# Patient Record
Sex: Female | Born: 1995 | Race: White | Hispanic: No | Marital: Single | Smoking: Never smoker
Health system: Southern US, Community
[De-identification: ages and names within clinical notes are randomized; demographics above are authoritative.]

## PROBLEM LIST (undated history)

## (undated) DIAGNOSIS — F329 Major depressive disorder, single episode, unspecified: Secondary | ICD-10-CM

## (undated) DIAGNOSIS — F32A Depression, unspecified: Secondary | ICD-10-CM

## (undated) DIAGNOSIS — F419 Anxiety disorder, unspecified: Secondary | ICD-10-CM

## (undated) DIAGNOSIS — L709 Acne, unspecified: Secondary | ICD-10-CM

## (undated) HISTORY — PX: WISDOM TOOTH EXTRACTION: SHX21

---
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
## 2014-05-14 ENCOUNTER — Encounter: Payer: Self-pay | Admitting: Sports Medicine

## 2014-05-14 ENCOUNTER — Other Ambulatory Visit: Payer: Self-pay | Admitting: Sports Medicine

## 2014-05-14 ENCOUNTER — Ambulatory Visit (INDEPENDENT_AMBULATORY_CARE_PROVIDER_SITE_OTHER): Payer: BLUE CROSS/BLUE SHIELD | Admitting: Sports Medicine

## 2014-05-14 ENCOUNTER — Ambulatory Visit
Admission: RE | Admit: 2014-05-14 | Discharge: 2014-05-14 | Disposition: A | Discharge status: Home / Self Care | Payer: BLUE CROSS/BLUE SHIELD | Source: Ambulatory Visit | Attending: Sports Medicine | Admitting: Sports Medicine

## 2014-05-14 VITALS — BP 119/85 | Ht 69.0 in | Wt 170.0 lb

## 2014-05-14 DIAGNOSIS — M25551 Pain in right hip: Secondary | ICD-10-CM | POA: Diagnosis not present

## 2014-05-14 DIAGNOSIS — G8929 Other chronic pain: Secondary | ICD-10-CM

## 2014-05-14 MED ORDER — DICLOFENAC 35 MG PO CAPS: ORAL_CAPSULE | ORAL | Status: DC

## 2014-05-15 DIAGNOSIS — G8929 Other chronic pain: Secondary | ICD-10-CM | POA: Insufficient documentation

## 2014-05-15 DIAGNOSIS — M25551 Pain in right hip: Secondary | ICD-10-CM

## 2014-05-15 DIAGNOSIS — F341 Dysthymic disorder: Secondary | ICD-10-CM | POA: Insufficient documentation

## 2014-05-15 MED ORDER — DICLOFENAC SODIUM 75 MG PO TBEC: 75.0000 mg | DELAYED_RELEASE_TABLET | Freq: Two times a day (BID) | ORAL | Status: DC

## 2014-05-15 NOTE — Progress Notes (Signed)
Alicia SomKailey M Gillespie - 19 y.o. female MRN [id number]  Date of birth: Nov 22, 1995  SUBJECTIVE:  Including CC & ROS.  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 Patient and parents at bedside reports that she's had on and off hip pain for approximately 3-4 years now with no provoking injury or trauma. She localizes her pain to her right groin describes the pain as a grinding sensation. Denies any sharp pains on mostly a dull ache. She reports that more recently over the past several weeks the pain has occurred and hasn't resolved as it usually does over a short period of time. Noted no recent injuries or trauma but has been playing softball regularly. She attempted to treat her pain with intermittent anti-inflammatories with no significant relief. She denies any clicking or locking but does report some decreased range of motion. Denies any numbness tingling or burning. Denies any weakness.  Other associated history patient has a history of frequent prednisone use as a child for respiratory issues proximally once a year. Her dad has a history of AVN diagnosed at the age 20 with a recent hip replacement in his 1850s. Patient runs fairly regularly approximate 10-20 miles a week outside of softball. She denies any pelvic pain, urinary symptoms, her vaginal symptoms.   ROS: Review of systems otherwise negative except for information present in HPI  HISTORY: Past Medical, Surgical, Social, and Family History Reviewed & Updated per EMR. Pertinent Historical Findings include: Relatively healthy with normal growth and development History of depression currently on Zoloft.  DATA REVIEWED: Patient has had no prior  x-rays or MRI of her hip in the past. AP pelvis X-rays were completed after patient's office visit today revealed no degenerative changes. No signs of AVN, no sclerosis. 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 Normal femoral head and neck.  PHYSICAL EXAM:  VS: BP:119/85 mmHg  HR: bpm  TEMP: ( )  RESP:   HT:5\' 9"  (175.3 cm)   WT:170 lb (77.111 kg)  BMI:25.2 Right HIP EXAM:  General: well nourished Skin of LE: warm; dry, no rashes, lesions, ecchymosis or erythema. Vascular: Dorsal pedal and femoral pulses 2+ bilaterally Neurologically: Sensation to light touch lower extremities equal and intact bilaterally.    Observation - no ecchymosis, edema, or hematoma present over the anterior, lateral, or posterior soft tissue surrounding the hip Palpation:  No anterior hip joint tenderness No tenderness over the pubic synthesis,  No tenderness of the the ASIS, no pain over the iliac crest,  No tenderness of the lateral greater trochanter or bursa, No PSIS tenderness or SI joint pain ROM: Normal Hip motion in flexion, extension, Slight decrease in external rotation of the right hip compared to the left. Slight decrease in internal rotation. Muscle strength: Mild pain/mild weakness in iliopsoas flexion, rectus femoral flexion, normal hamstring and gluteal extension, hip adduction or abduction.  No pain or weakness with gluteus medius and minimus hip abduction, abdominal muscle contraction forward or oblique positions. Normal gait   MSK US: Ultrasound of the femoral head  and neck reveal no signs of bony abnormalities stress fracture. Musculature around anterior hip also appears normal with no signs of tears.  ASSESSMENT & PLAN: See problem based charting & AVS for pt instructions. Impression: Currently on the differential include pincer deformity causing intermittent hip dysfunction and pain. Also possible is labral pathology. Also possible is muscle tendinitis of the hip  flexors.  Recommendations: -Given the patient's pain in the past has been short-lived and resolved with no intervention and that she has been playing softball more regularly this year who recommended relative rest  initially after softball season to see if this calms down her pain. -We'll start her on diclofenac 75 mg twice a day to help control inflammation and pain. -No signs of acute abnormalities or AVN on her hip x-rays although she does have a pincer deformity that could be contributing to her pain. -Will follow-up with patient in 4-5 weeks if symptoms are not improving after relative rest and anti-inflammatories will likely proceed with MRI possibly MRI arthrogram to evaluate acetabulum labrum localized musculature.

## 2014-05-27 ENCOUNTER — Other Ambulatory Visit: Payer: Self-pay | Admitting: *Deleted

## 2014-05-27 MED ORDER — MELOXICAM 15 MG PO TABS: 15.0000 mg | ORAL_TABLET | Freq: Every day | ORAL | Status: DC

## 2014-05-28 ENCOUNTER — Ambulatory Visit: Payer: BLUE CROSS/BLUE SHIELD | Admitting: Sports Medicine

## 2014-06-11 ENCOUNTER — Ambulatory Visit (INDEPENDENT_AMBULATORY_CARE_PROVIDER_SITE_OTHER): Payer: BLUE CROSS/BLUE SHIELD | Admitting: Sports Medicine

## 2014-06-11 ENCOUNTER — Encounter: Payer: Self-pay | Admitting: Sports Medicine

## 2014-06-11 VITALS — BP 116/73 | HR 75 | Ht 69.0 in | Wt 170.0 lb

## 2014-06-11 DIAGNOSIS — M25551 Pain in right hip: Secondary | ICD-10-CM

## 2014-06-11 DIAGNOSIS — G8929 Other chronic pain: Secondary | ICD-10-CM | POA: Diagnosis not present

## 2014-06-11 NOTE — Progress Notes (Signed)
Alicia Gillespie - 19 y.o. female MRN [id number]  Date of birth: [birth year]  SUBJECTIVE:  Including CC & ROS.  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 Patient and mom and bedside report that since she was last seen 1 month ago after completing her softball season and taking anti-inflammatory medication with diclofenac and then meloxicam patient has had no clinical improvement in her right groin and hip pain. She did not tolerate anti-inflammatory well because of GI upset.  Patient has had this pain off and on for 3-4 years is no provoking injury or trauma. She continues to describe a dull achy pain deep in her right groin with occasional sharp pain with activity as minimal as walking. She also said occasionally describes it as a grinding sensation. The pain has not changed at all since he stopped playing softball. X-rays done one month ago revealed mild pincer deformity at the superior aspect of the acetabulum but otherwise no signs of fracture or AVN. Patient denies any associated weakness, numbness, tingling, radiation or swelling but does report a occasional antalgic gait.  She denies any pelvic pain, urinary symptoms, her vaginal symptoms.   ROS: Review of systems otherwise negative except for information present in HPI  HISTORY: Past Medical, Surgical, Social, and Family History Reviewed & Updated per EMR. Pertinent Historical Findings include: Relatively healthy with normal growth and development History of depression currently on Zoloft. History of AVN of the hips and patient's father   DATA REVIEWED: AP pelvis X-rays were completed after patient's office visit today revealed no degenerative changes. No signs of AVN, no sclerosis. Patient does have a pincer deformity at the superior aspect of the acetabulum. Normal femoral head and neck.  PHYSICAL EXAM:  VS: BP:116/73 mmHg  HR:75bpm  TEMP: ( )  RESP:   HT:5\' 9"   (175.3 cm)   WT:170 lb (77.111 kg)  BMI:25.2 Right HIP EXAM:  General: well nourished Skin of LE: warm; dry, no rashes, lesions, ecchymosis or erythema. Vascular: Dorsal pedal and femoral pulses 2+ bilaterally Neurologically: Sensation to light touch lower extremities equal and intact bilaterally.    Observation - no ecchymosis, edema, or hematoma present over the anterior, lateral, or posterior soft tissue surrounding the hip Palpation:  No anterior hip joint tenderness No tenderness over the pubic synthesis,  No tenderness of the the ASIS, no pain over the iliac crest,  No tenderness of the lateral greater trochanter or bursa, No PSIS tenderness or SI joint pain ROM: Normal Hip motion in flexion, extension, Slight decrease in external rotation of the right hip compared to the left  with associated pain in external rotation deep in the groin. Muscle strength: Intact strength on right in iliopsoas flexion, rectus femoral flexion, normal hamstring and gluteal extension, hip adduction or abduction.  No pain or weakness with gluteus medius and minimus hip abduction, abdominal muscle contraction forward or oblique positions. Normal gait with only slight valgus of the knees in internal rotation of the hips with running  ASSESSMENT & PLAN: See problem based charting & AVS for pt instructions. Impression: Suspected right hip labral tear Underlying pincer deformity Underlying gait abnormalities  Recommendations: -At this point given patient's long-standing history of right hip discomfort and pain with activities with failed conservative management with anti-inflammatory, relative rest, and negative x-rays further differentiate this injury and evaluating for suspected labral tear will be found with MRI arthrogram of the right hip. -Advised patient she can  return to activities as tolerated within her ROM of pain avoiding activity such as running and favoring swimming and biking. -Discontinue it by  inflammatory is given her GI upset using only Tylenol for pain control as needed. -Will follow-up with patient over the phone with MRI results

## 2014-06-25 ENCOUNTER — Ambulatory Visit
Admission: RE | Admit: 2014-06-25 | Discharge: 2014-06-25 | Disposition: A | Discharge status: Home / Self Care | Payer: BLUE CROSS/BLUE SHIELD | Source: Ambulatory Visit | Attending: Sports Medicine | Admitting: Sports Medicine

## 2014-06-25 DIAGNOSIS — M25551 Pain in right hip: Principal | ICD-10-CM

## 2014-06-25 DIAGNOSIS — G8929 Other chronic pain: Secondary | ICD-10-CM

## 2014-06-25 MED ORDER — IOHEXOL 180 MG/ML  SOLN
10.0000 mL | Freq: Once | INTRAMUSCULAR | Status: AC | PRN
  Administered 2014-06-25: 10 mL via INTRA_ARTICULAR

## 2014-06-26 ENCOUNTER — Telehealth: Payer: Self-pay | Admitting: Sports Medicine

## 2014-06-26 DIAGNOSIS — M25551 Pain in right hip: Principal | ICD-10-CM

## 2014-06-26 DIAGNOSIS — G8929 Other chronic pain: Secondary | ICD-10-CM

## 2014-06-26 NOTE — Telephone Encounter (Addendum)
Discussed in length with the patient and her father over the phone her MRI results and options for treatment including consideration for conservative management with intra-articular injection and possible PT or referral to Ortho hip specialist to discuss the merits of surgery.   The family plans to discuss options this weekend and call us back with their preferred plan.   FINDINGS: No significant abnormal osseous edema. Regional musculature unremarkable.  On images 9 through 11 of series 5 there seems to be subtle linear increased signal tracking the along the labral surface suspicious for labral tear. This does not show up on other sequences. Overall I favor this is representing a subtle nondisplaced tear of the superior-posterior labrum on the strength of the coronal images, but admittedly the finding is highly subtle.  No other significant regional abnormality is identified.  IMPRESSION: 1. Highly subtle linear increased signal tracking along the base of the superior and posterior superior labrum is suspicious for a tiny surface tear. Admittedly this finding is subtle and not well correlated in multiple imaging planes, and because of this I recommend extra emphasis on the patient's history and clinical presentation if arthroscopy is being contemplated. I do not see a different cause for the patient's right hip pain.   Electronically Signed By: Gaylyn Rong M.D. On: 06/25/2014 16:18

## 2014-06-30 NOTE — Addendum Note (Signed)
Addended by: Lillia Pauls C on: 06/30/2014 12:00 PM   Modules accepted: Orders

## 2015-06-08 DIAGNOSIS — R51 Headache: Secondary | ICD-10-CM | POA: Diagnosis not present

## 2015-08-04 DIAGNOSIS — R350 Frequency of micturition: Secondary | ICD-10-CM | POA: Diagnosis not present

## 2016-02-09 ENCOUNTER — Ambulatory Visit
Admission: RE | Admit: 2016-02-09 | Discharge: 2016-02-09 | Disposition: A | Discharge status: Home / Self Care | Payer: BLUE CROSS/BLUE SHIELD | Source: Ambulatory Visit | Attending: Student | Admitting: Student

## 2016-02-09 ENCOUNTER — Ambulatory Visit (INDEPENDENT_AMBULATORY_CARE_PROVIDER_SITE_OTHER): Payer: BLUE CROSS/BLUE SHIELD | Admitting: Student

## 2016-02-09 ENCOUNTER — Other Ambulatory Visit: Payer: Self-pay | Admitting: Student

## 2016-02-09 ENCOUNTER — Encounter: Payer: Self-pay | Admitting: Student

## 2016-02-09 VITALS — BP 135/86 | HR 97 | Ht 67.0 in | Wt 172.0 lb

## 2016-02-09 DIAGNOSIS — M25551 Pain in right hip: Secondary | ICD-10-CM

## 2016-02-09 NOTE — Assessment & Plan Note (Addendum)
Likely bone bruising on pubic ramus that will resolve with time.  However, will get x-rays to rule out fracture.  RTP will be based on if her x-rays are negative. If negative, she can play as long as she can tolerate running. Will notify athletic trainer and patient when x-rays return.

## 2016-02-09 NOTE — Progress Notes (Signed)
  Alicia Gillespie - 21 y.o. female MRN [id number]  Date of birth: 22-Oct-1995  SUBJECTIVE:  Including CC & ROS.  CC: right hip pain  Alicia Gillespie is a [employer] who presents after being hit in the right hip with a softball on Sunday. She reports that she has pain to the area and she did have some vaginal bleeding up until last night. LMP last week, but stopped the day before she was hit.  She is not sexually active.  She has noted no weakness and had no problems with practice. She denies any hematuria. She has pain in the area that she was hit and nowhere else.  Her last menstrual period was last week and ended before she was hit in the head. 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  She does not report any problems with this.  Denies any pain down her leg or numbness or tingling.     ROS: No unexpected weight loss, fever, chills, swelling, instability, muscle pain, numbness/tingling, redness, otherwise see HPI   PMHx - Updated and reviewed.  Contributory factors include: Negative PSHx - Updated and reviewed.  Contributory factors include:  Negative FHx - Updated and reviewed.  Contributory factors include:  Negative Social Hx - Updated and reviewed. Contributory factors include: Negative Medications - reviewed   DATA REVIEWED: None  PHYSICAL EXAM:  VS: BP:135/86  HR:97bpm  TEMP: ( )  RESP:   HT:5\' 7"  (170.2 cm)   WT:172 lb (78 kg)  BMI:27 PHYSICAL EXAM: Gen: NAD, alert, cooperative with exam, well-appearing HEENT: clear conjunctiva,  CV:  no edema, capillary refill brisk, normal rate Resp: non-labored Skin: no rashes, normal turgor, ecchymosis present on her anterior superior thigh  Neuro: no gross deficits.  Psych:  alert and oriented GYN: Bimanual exam performed, no CMT, no active bleeding seen.    Hip: ROM full in all planes  Strength IR: 5/5, ER: 5/5, Flexion: 5/5,  Extension: 5/5, Abduction: 5/5, Adduction: 5/5 Tenderness to palpation along the pubic ramus on the right, none at the ASIS Pelvic alignment unremarkable to inspection and palpation.   ASSESSMENT & PLAN:   Pain, joint, pelvic region, right Likely bone bruising on pubic ramus that will resolve with time.  However, will get x-rays to rule out fracture.  RTP will be based on if her x-rays are negative. If negative, she can play as long as she can tolerate running. Will notify athletic trainer and patient when x-rays return.   Addendum: Right hip x-rays showed no fracture. Notified Event organiserathletic trainer. Attempted to notify patient was unable to give the results to her father that she had not given release of medical information. She did agree to give results to athletic trainer. Left voice for patient to call back for results.

## 2016-02-13 ENCOUNTER — Telehealth: Payer: Self-pay | Admitting: Student

## 2016-02-13 NOTE — Telephone Encounter (Signed)
Called patient and verified name/DOB.  Notified of negative x-ray results.  Can play softball as tolerated.  Return if bleeding vaginally returns or has hematuria.    Signed,  Corliss MarcusAlicia Barnes, DO [facility]

## 2016-02-13 NOTE — Telephone Encounter (Signed)
-----   Message from Frankey Poothea N McCulloch, RN sent at 02/10/2016  9:24 AM EST ----- Regarding: FW: phone message Contact: [phone]   ----- Message ----- From: Lizbeth BarkMelanie L Ceresi Sent: 02/10/2016   9:12 AM To: Frankey Poothea N McCulloch, RN Subject: phone message                                  Pt is asking for xray results

## 2016-03-06 DIAGNOSIS — S62307A Unspecified fracture of fifth metacarpal bone, left hand, initial encounter for closed fracture: Secondary | ICD-10-CM | POA: Diagnosis not present

## 2016-03-06 DIAGNOSIS — M79642 Pain in left hand: Secondary | ICD-10-CM | POA: Diagnosis not present

## 2016-03-16 DIAGNOSIS — M79642 Pain in left hand: Secondary | ICD-10-CM | POA: Diagnosis not present

## 2016-05-07 DIAGNOSIS — F430 Acute stress reaction: Secondary | ICD-10-CM | POA: Diagnosis not present

## 2016-05-07 DIAGNOSIS — F4329 Adjustment disorder with other symptoms: Secondary | ICD-10-CM | POA: Diagnosis not present

## 2016-05-07 DIAGNOSIS — F410 Panic disorder [episodic paroxysmal anxiety] without agoraphobia: Secondary | ICD-10-CM | POA: Diagnosis not present

## 2016-05-07 DIAGNOSIS — F411 Generalized anxiety disorder: Secondary | ICD-10-CM | POA: Diagnosis not present

## 2016-10-25 IMAGING — MR MR HIP*R* W/CM
5 series · 36 of 40 positions shown · IV contrast (agent unspecified)
Comparison: 05/14/2014

CLINICAL DATA: Right hip pain for several months

EXAM:
MRI OF THE RIGHT HIP WITH CONTRAST(MR Arthrogram)
TECHNIQUE: Multiplanar, multisequence MR imaging of the hip was performed
immediately following contrast injection into the hip joint under
fluoroscopic guidance. No intravenous contrast was administered.

[Series 3: T1 · axial · 4.0mm · 0.42mm/px · z∈[-99,-0]mm · 7 of 20 slices shown (1 of 4)]
[im 1/20]
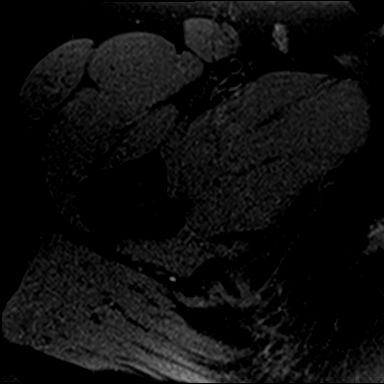
[im 4/20]
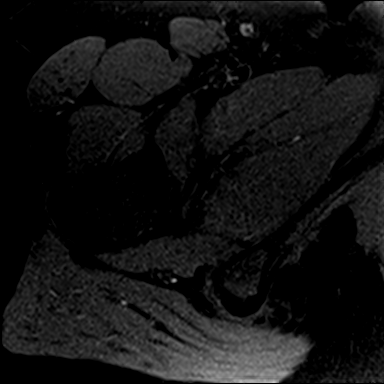
[im 7/20]
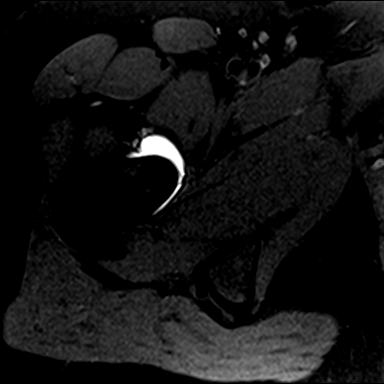
[im 10/20]
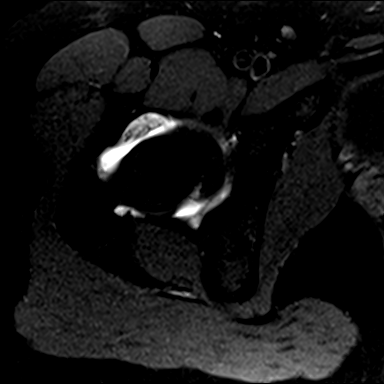
[im 13/20]
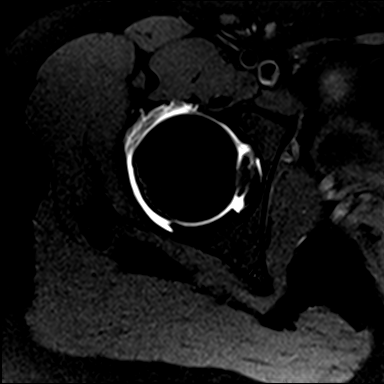
[im 16/20]
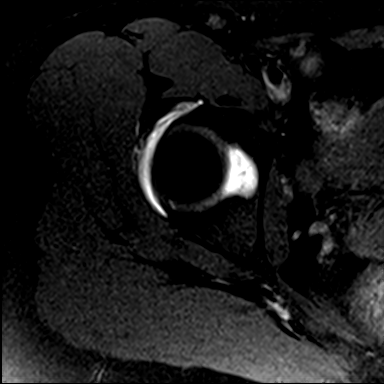
[im 20/20]
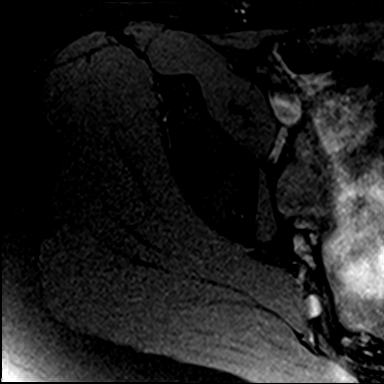

[Series 4: T1 · sagittal · 4.0mm · 0.83mm/px · 9 of 21 slices shown (2 of 4)]
[im 1/21]
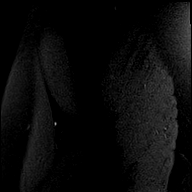
[im 3/21]
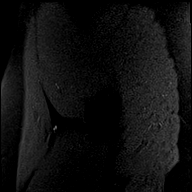
[im 6/21]
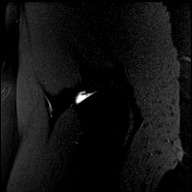
[im 8/21]
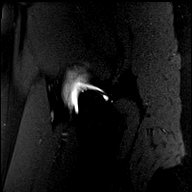
[im 11/21]
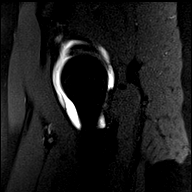
[im 13/21]
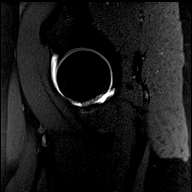
[im 16/21]
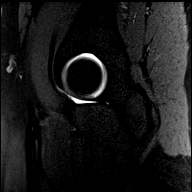
[im 18/21]
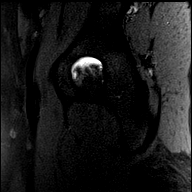
[im 21/21]
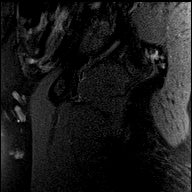

[Series 5: T1 · coronal · 4.0mm · 0.83mm/px · 8 of 18 slices shown (3 of 4)]
[im 1/18]
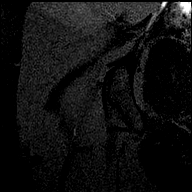
[im 3/18]
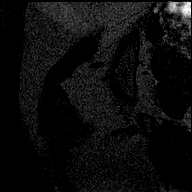
[im 5/18]
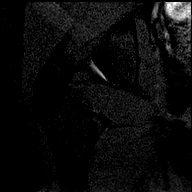
[im 8/18]
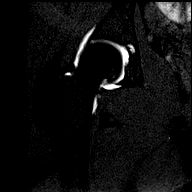
[im 10/18]
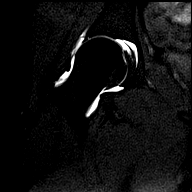
[im 13/18]
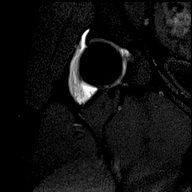
[im 15/18]
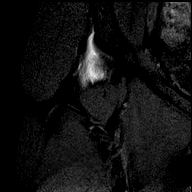
[im 18/18]
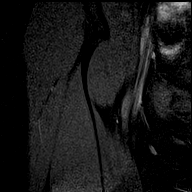

[Series 6: T2 · coronal · 4.0mm · 0.70mm/px · 8 of 18 slices shown]
[im 1/18]
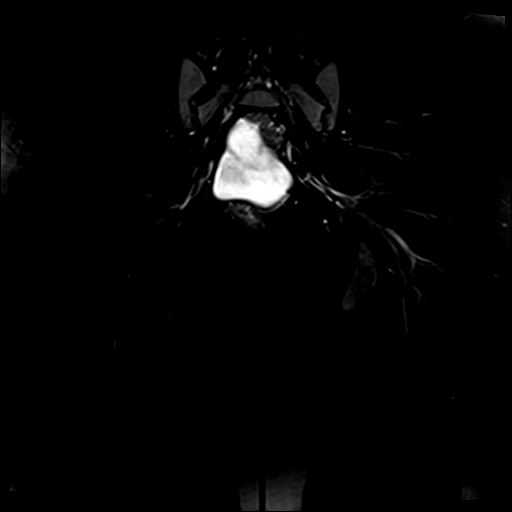
[im 3/18]
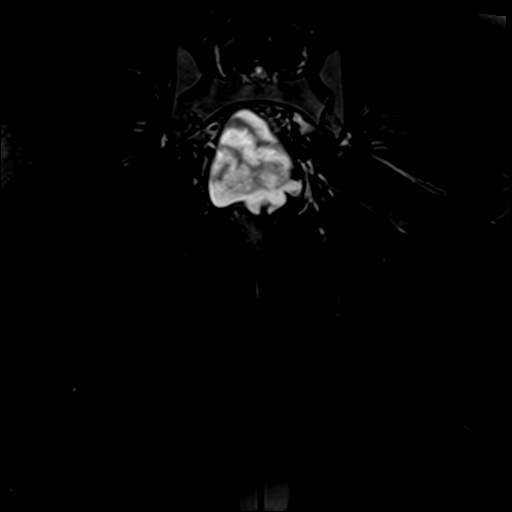
[im 5/18]
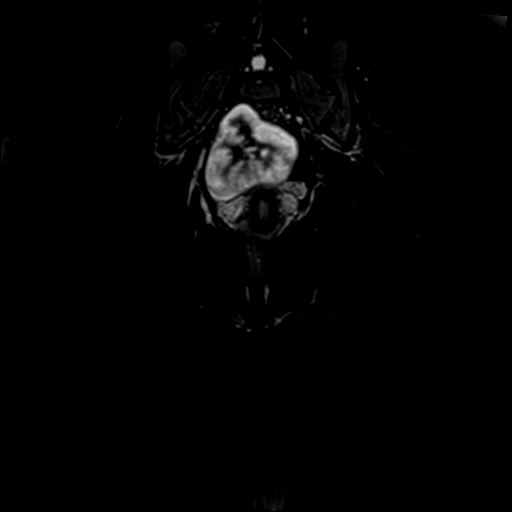
[im 8/18]
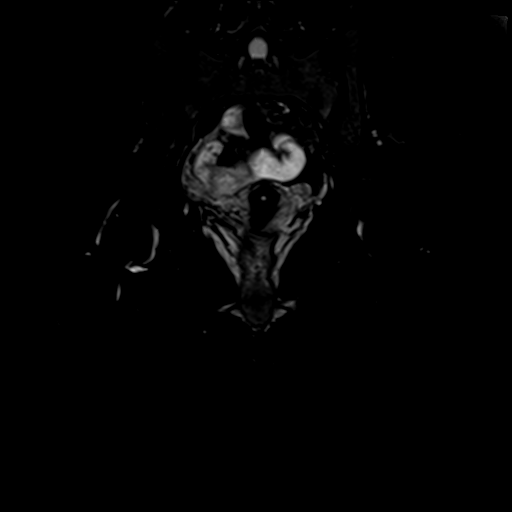
[im 10/18]
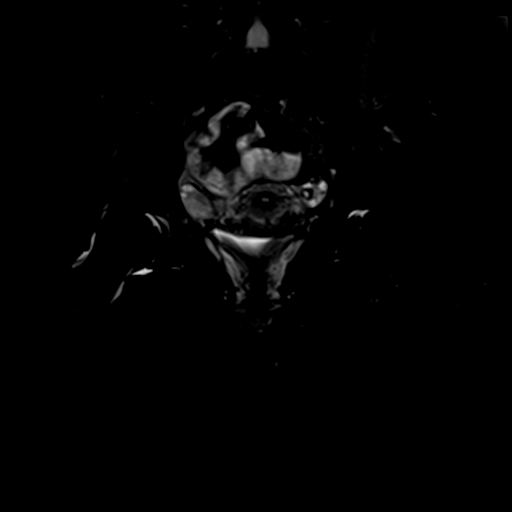
[im 13/18]
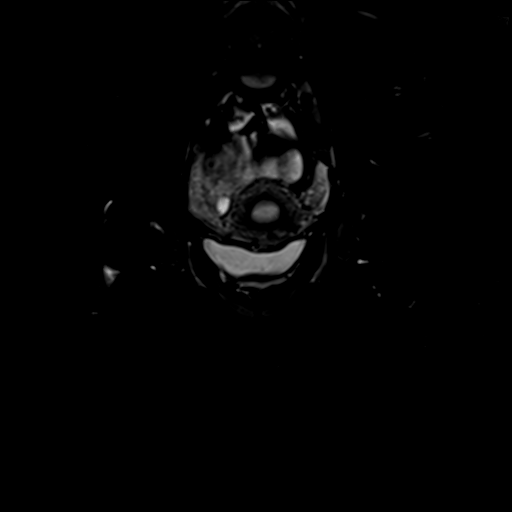
[im 15/18]
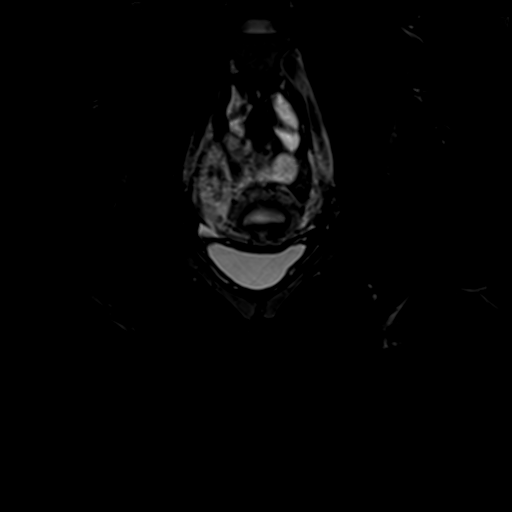
[im 18/18]
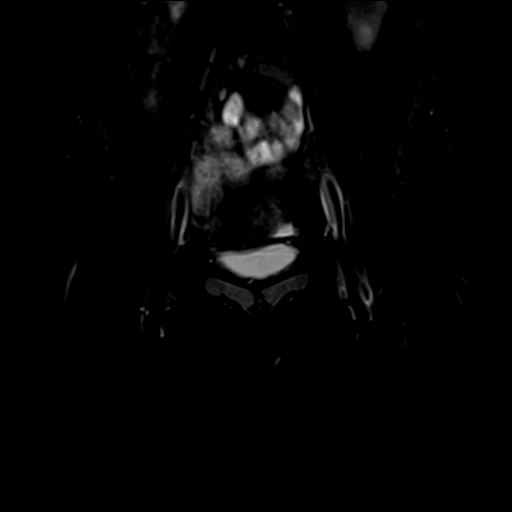

[Series 7: T1 · coronal · 4.0mm · 0.70mm/px · 4 of 18 slices shown (4 of 4)]
[im 1/18]
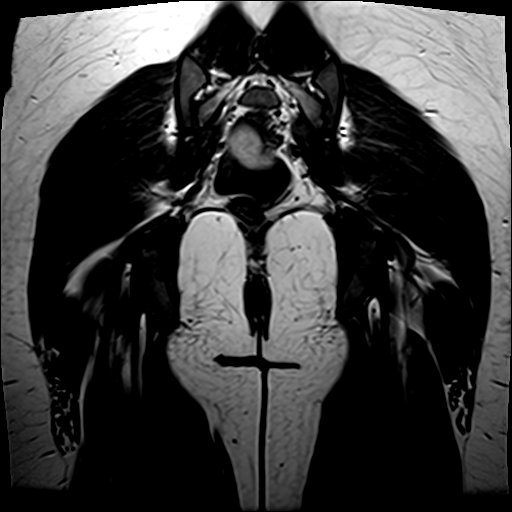
[im 3/18]
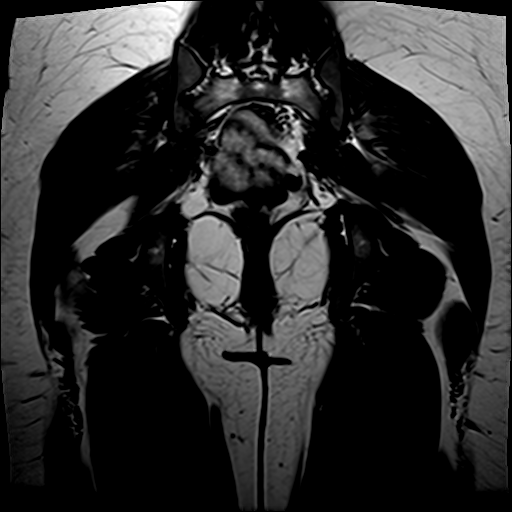
[im 5/18]
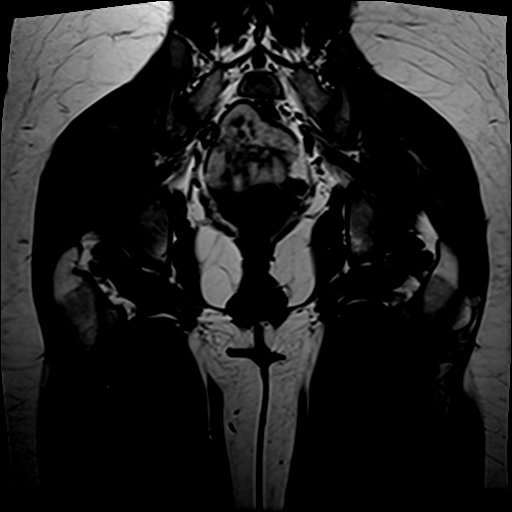
[im 8/18]
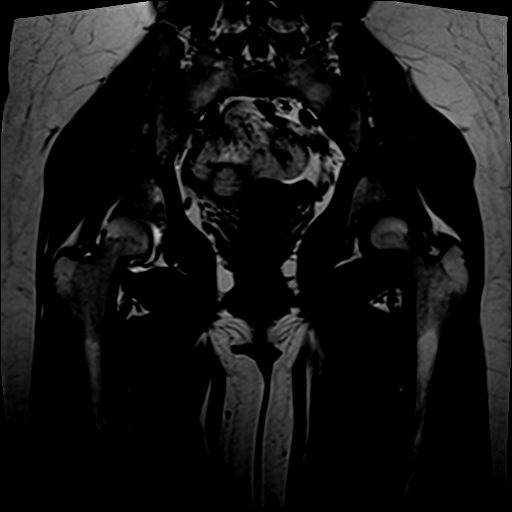

[36 of 40 positions shown; findings below may reference images not displayed]

FINDINGS: No significant abnormal osseous edema. Regional musculature
unremarkable.

On images 9 through 11 of series 5 there seems to be subtle linear
increased signal tracking the along the labral surface suspicious
for labral tear. This does not show up on other sequences. Overall I
favor this is representing a subtle nondisplaced tear of the
superior-posterior labrum on the strength of the coronal images, but
admittedly the finding is highly subtle.

No other significant regional abnormality is identified.
IMPRESSION: 1. Highly subtle linear increased signal tracking along the base of
the superior and posterior superior labrum is suspicious for a tiny
surface tear. Admittedly this finding is subtle and not well
correlated in multiple imaging planes, and because of this I
recommend extra emphasis on the patient's history and clinical
presentation if arthroscopy is being contemplated. I do not see a
different cause for the patient's right hip pain.

## 2018-01-25 DIAGNOSIS — R05 Cough: Secondary | ICD-10-CM | POA: Diagnosis not present

## 2018-01-25 DIAGNOSIS — J040 Acute laryngitis: Secondary | ICD-10-CM | POA: Diagnosis not present

## 2018-03-13 DIAGNOSIS — Z7689 Persons encountering health services in other specified circumstances: Secondary | ICD-10-CM | POA: Diagnosis not present

## 2018-03-13 DIAGNOSIS — F900 Attention-deficit hyperactivity disorder, predominantly inattentive type: Secondary | ICD-10-CM | POA: Diagnosis not present

## 2018-03-13 DIAGNOSIS — F411 Generalized anxiety disorder: Secondary | ICD-10-CM | POA: Diagnosis not present

## 2018-03-13 DIAGNOSIS — Z79899 Other long term (current) drug therapy: Secondary | ICD-10-CM | POA: Diagnosis not present

## 2018-03-14 DIAGNOSIS — Z79899 Other long term (current) drug therapy: Secondary | ICD-10-CM | POA: Diagnosis not present

## 2018-03-14 DIAGNOSIS — Z0000 Encounter for general adult medical examination without abnormal findings: Secondary | ICD-10-CM | POA: Diagnosis not present

## 2018-06-11 IMAGING — CR DG HIP (WITH OR WITHOUT PELVIS) 2-3V*R*
2 series · 2 of 2 positions shown · non-contrast
Comparison: None.

CLINICAL DATA: Hit with softball a few days ago with persistent
right hip pain, initial encounter

EXAM:
DG HIP (WITH OR WITHOUT PELVIS) 2-3V RIGHT

[w pelvis]
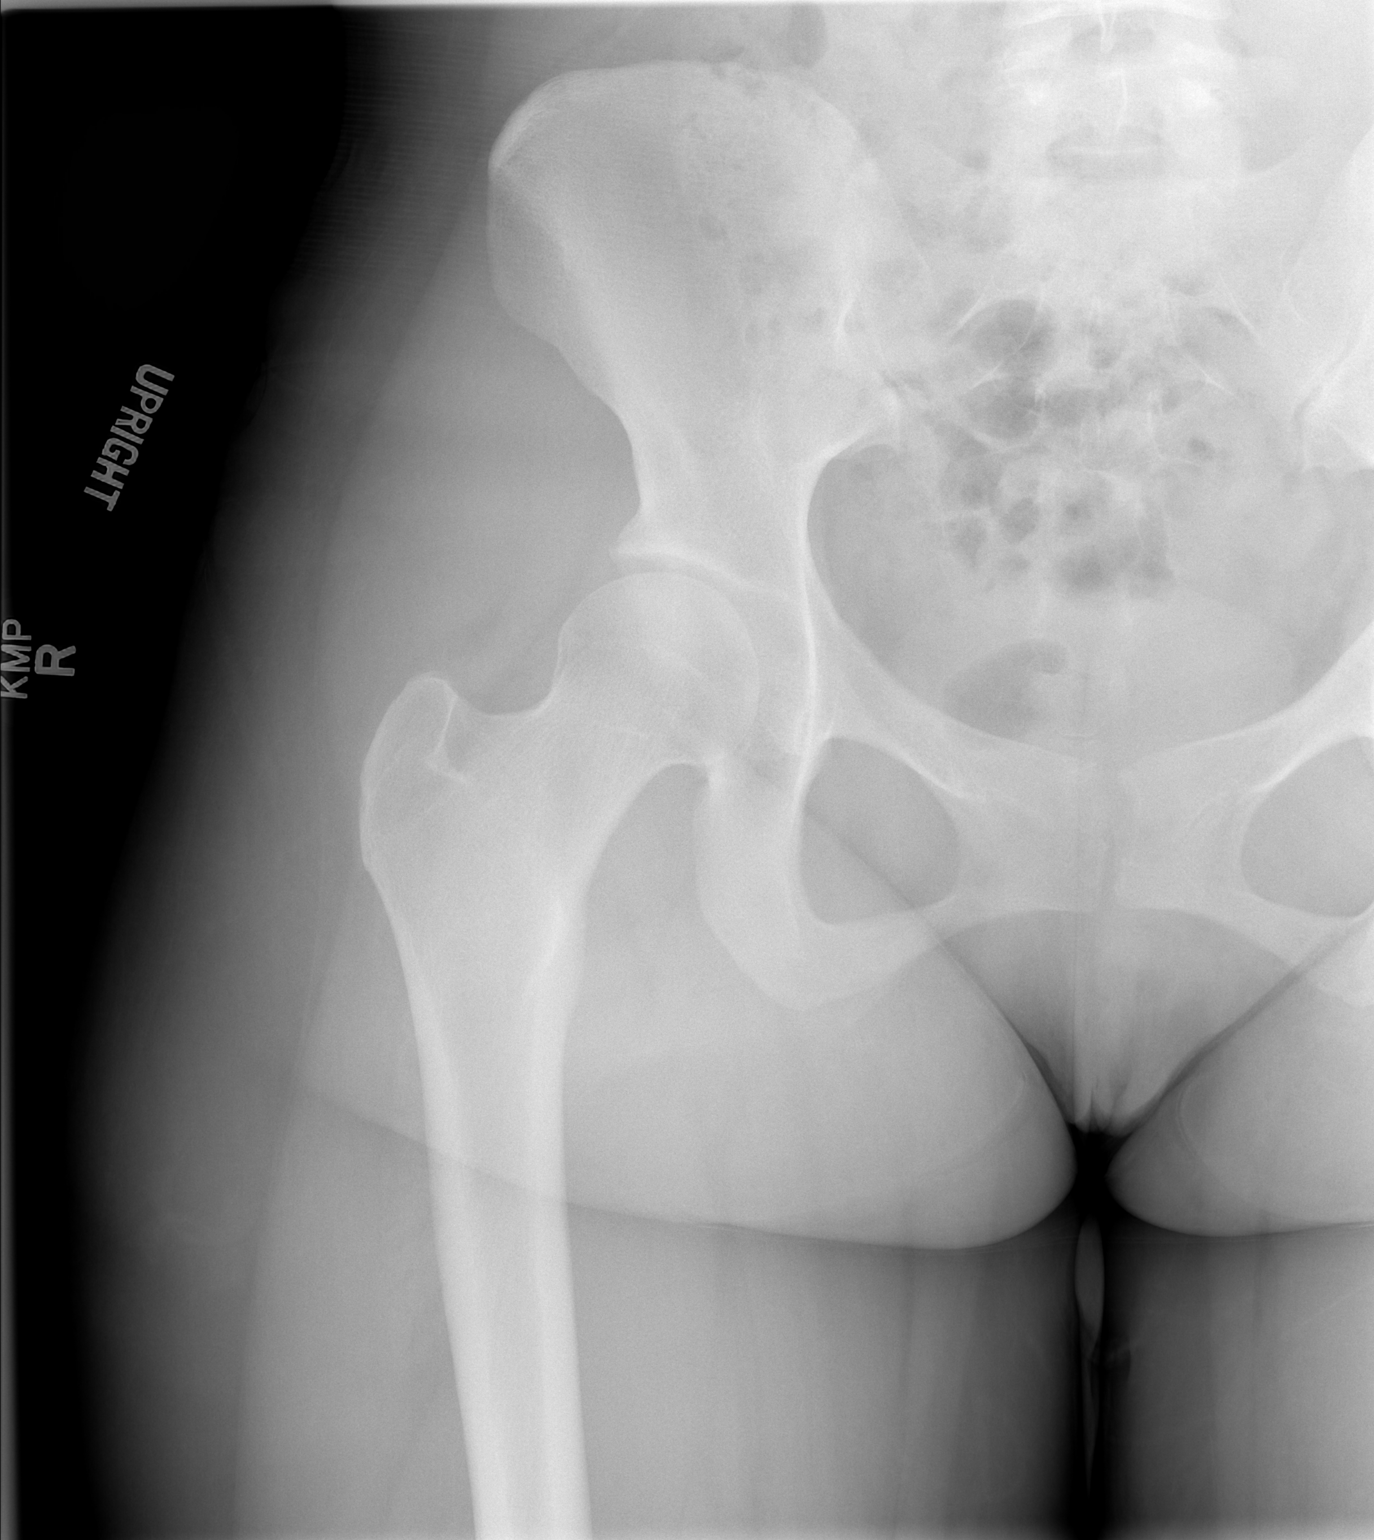

[t hip frog leg right]
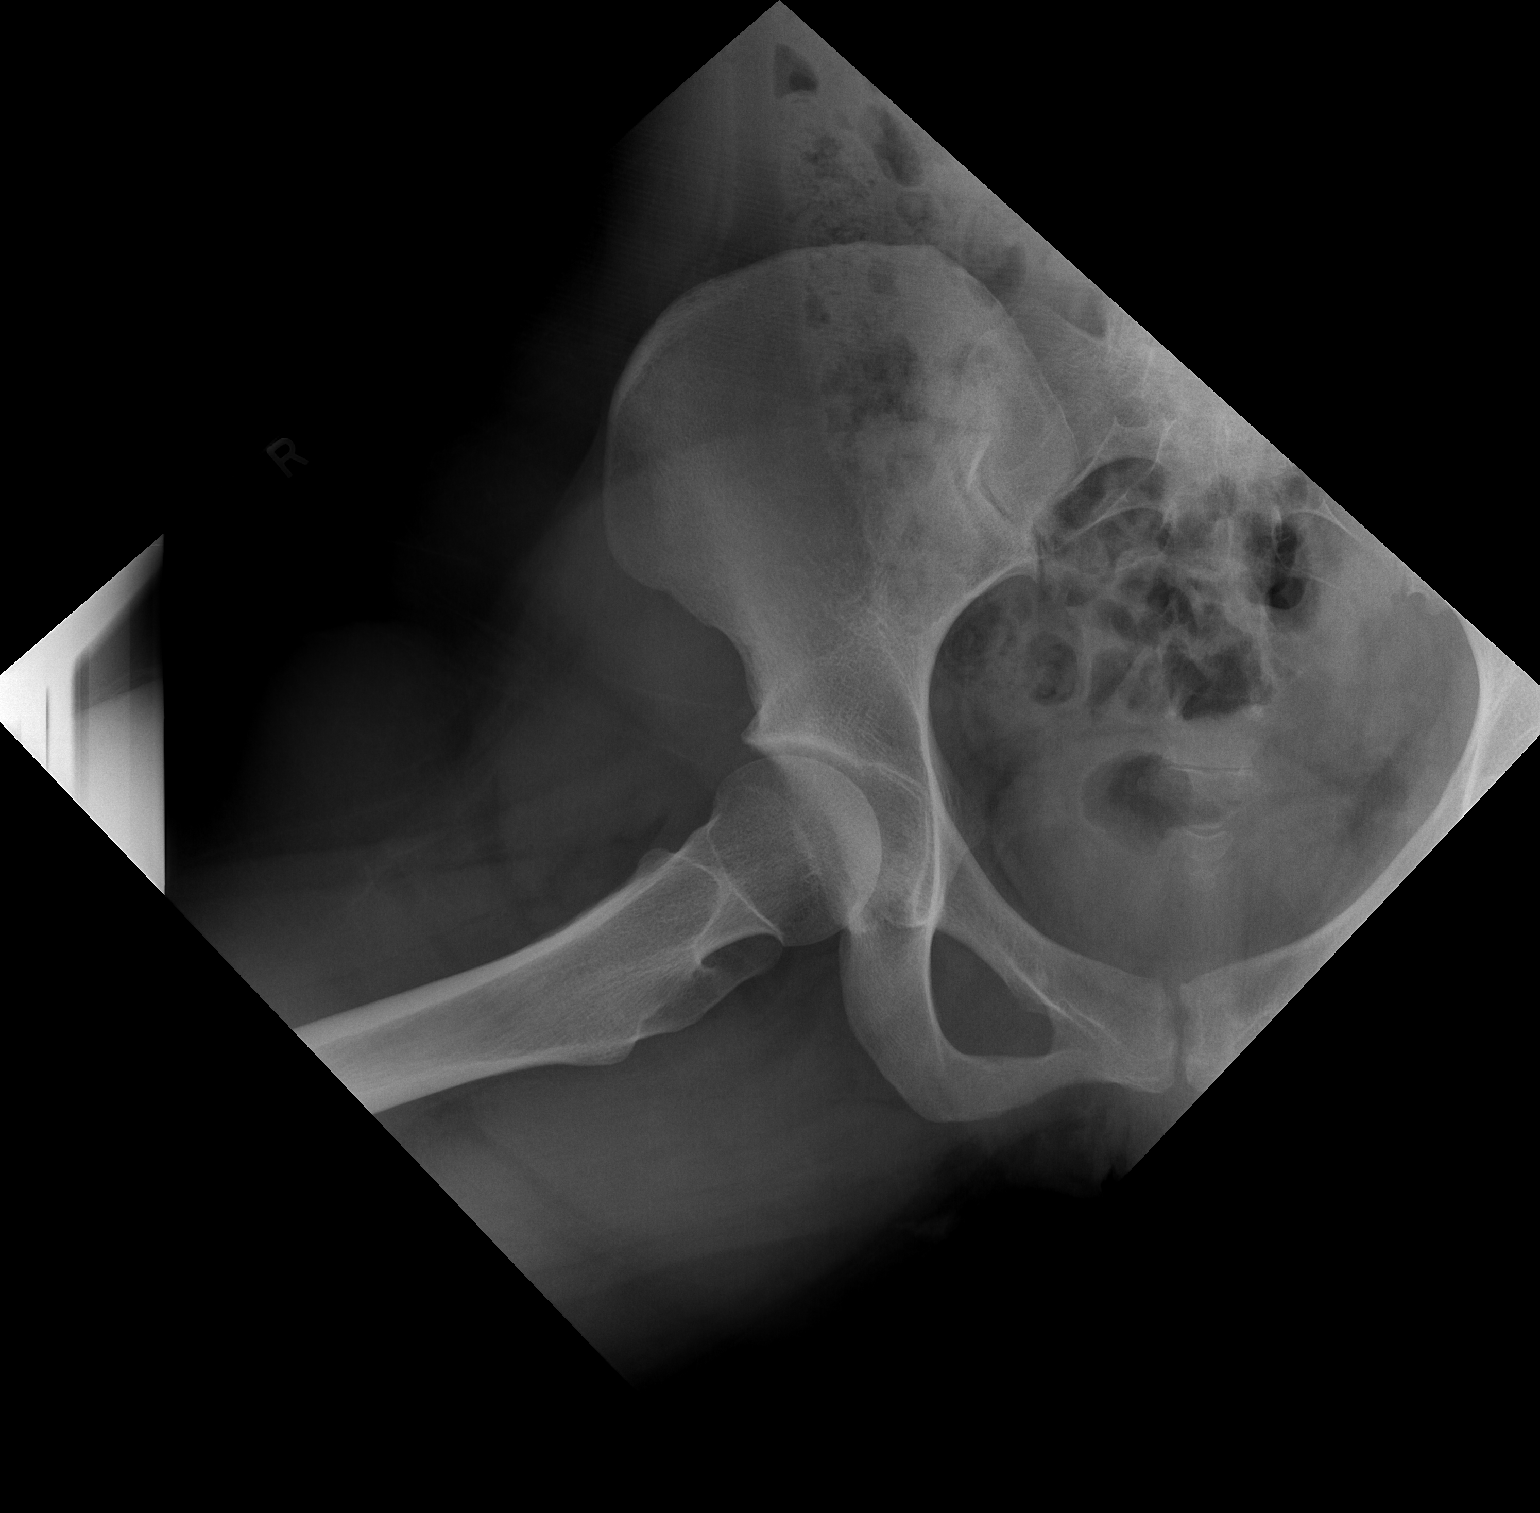

[2 of 2 positions shown; findings below may reference images not displayed]

FINDINGS: There is no evidence of hip fracture or dislocation. There is no
evidence of arthropathy or other focal bone abnormality.
IMPRESSION: No acute abnormality noted.

## 2018-06-13 DIAGNOSIS — Z124 Encounter for screening for malignant neoplasm of cervix: Secondary | ICD-10-CM | POA: Diagnosis not present

## 2018-06-13 DIAGNOSIS — F900 Attention-deficit hyperactivity disorder, predominantly inattentive type: Secondary | ICD-10-CM | POA: Diagnosis not present

## 2018-06-13 DIAGNOSIS — Z0000 Encounter for general adult medical examination without abnormal findings: Secondary | ICD-10-CM | POA: Diagnosis not present

## 2022-09-12 ENCOUNTER — Other Ambulatory Visit (HOSPITAL_BASED_OUTPATIENT_CLINIC_OR_DEPARTMENT_OTHER): Payer: Self-pay

## 2022-09-12 MED ORDER — WEGOVY 1 MG/0.5ML ~~LOC~~ SOAJ
1.0000 mg | SUBCUTANEOUS | 0 refills | Status: AC
  Filled 2022-09-12: qty 2, 28d supply, fill #0

## 2022-09-15 ENCOUNTER — Other Ambulatory Visit (HOSPITAL_BASED_OUTPATIENT_CLINIC_OR_DEPARTMENT_OTHER): Payer: Self-pay

## 2022-11-15 ENCOUNTER — Other Ambulatory Visit (HOSPITAL_BASED_OUTPATIENT_CLINIC_OR_DEPARTMENT_OTHER): Payer: Self-pay
